# Patient Record
Sex: Male | Born: 1955
Health system: Southern US, Community
[De-identification: ages and names within clinical notes are randomized; demographics above are authoritative.]

## PROBLEM LIST (undated history)

## (undated) DIAGNOSIS — H532 Diplopia: Secondary | ICD-10-CM

## (undated) DIAGNOSIS — I10 Essential (primary) hypertension: Secondary | ICD-10-CM

## (undated) DIAGNOSIS — H02409 Unspecified ptosis of unspecified eyelid: Secondary | ICD-10-CM

## (undated) DIAGNOSIS — G7000 Myasthenia gravis without (acute) exacerbation: Secondary | ICD-10-CM

## (undated) HISTORY — DX: Myasthenia gravis without (acute) exacerbation: G70.00

## (undated) HISTORY — DX: Essential (primary) hypertension: I10

## (undated) HISTORY — PX: NO PAST SURGERIES: SHX2092

## (undated) HISTORY — DX: Diplopia: H53.2

## (undated) HISTORY — DX: Unspecified ptosis of unspecified eyelid: H02.409

---

## 2011-07-18 ENCOUNTER — Other Ambulatory Visit: Payer: Self-pay | Admitting: Neurology

## 2011-07-18 DIAGNOSIS — H532 Diplopia: Secondary | ICD-10-CM

## 2011-07-18 DIAGNOSIS — G7000 Myasthenia gravis without (acute) exacerbation: Secondary | ICD-10-CM

## 2011-07-18 DIAGNOSIS — H02409 Unspecified ptosis of unspecified eyelid: Secondary | ICD-10-CM

## 2011-07-30 ENCOUNTER — Ambulatory Visit
Admission: RE | Admit: 2011-07-30 | Discharge: 2011-07-30 | Disposition: A | Discharge status: Home / Self Care | Payer: BC Managed Care – PPO | Source: Ambulatory Visit | Attending: Neurology | Admitting: Neurology

## 2011-07-30 DIAGNOSIS — H02409 Unspecified ptosis of unspecified eyelid: Secondary | ICD-10-CM

## 2011-07-30 DIAGNOSIS — G7000 Myasthenia gravis without (acute) exacerbation: Secondary | ICD-10-CM

## 2011-07-30 DIAGNOSIS — H532 Diplopia: Secondary | ICD-10-CM

## 2012-10-01 DIAGNOSIS — M7582 Other shoulder lesions, left shoulder: Secondary | ICD-10-CM | POA: Insufficient documentation

## 2012-10-01 DIAGNOSIS — M7542 Impingement syndrome of left shoulder: Secondary | ICD-10-CM | POA: Insufficient documentation

## 2013-05-25 DIAGNOSIS — M7541 Impingement syndrome of right shoulder: Secondary | ICD-10-CM | POA: Insufficient documentation

## 2014-06-13 ENCOUNTER — Encounter: Payer: Self-pay | Admitting: Neurology

## 2014-06-13 ENCOUNTER — Ambulatory Visit (INDEPENDENT_AMBULATORY_CARE_PROVIDER_SITE_OTHER): Payer: BLUE CROSS/BLUE SHIELD | Admitting: Neurology

## 2014-06-13 VITALS — BP 155/96 | HR 104 | Ht 71.0 in | Wt 149.0 lb

## 2014-06-13 DIAGNOSIS — G7000 Myasthenia gravis without (acute) exacerbation: Secondary | ICD-10-CM | POA: Insufficient documentation

## 2014-06-13 DIAGNOSIS — H532 Diplopia: Secondary | ICD-10-CM | POA: Insufficient documentation

## 2014-06-13 DIAGNOSIS — H02409 Unspecified ptosis of unspecified eyelid: Secondary | ICD-10-CM | POA: Insufficient documentation

## 2014-06-13 MED ORDER — PYRIDOSTIGMINE BROMIDE 60 MG PO TABS: 60.0000 mg | ORAL_TABLET | Freq: Two times a day (BID) | ORAL | Status: DC

## 2014-06-13 NOTE — Progress Notes (Signed)
PATIENT: Shawn Carter DOB: 20-Jul-1955  HISTORICAL  Shawn Carter is a 59 year old right-handed Caucasian male, follow-up for seronegative ocular myasthenia gravis.  He was diagnosed with myasthenia gravis in 2013, presented with left fatigable ptosis, intermittent diplopia in 2012, but never had bulbar, or limb muscle involvement, Previous MRI of the brain was normal, normal or negative acetylcholine receptor antibody, CPK, B12, C-reactive protein, RPR, ANA, CMP, CBC.  MUSK antibody was not performed, because of high co-pay Single-fiber EMG [facility] by Dr. Jeremy JohannJanice Massey showed increased jitter at left orbicularis oculi, consistent with neuromuscular junctional disorder  CT chest showed no evidence of thymoma  He was given a trial of Mestinon treatment, complains of urinary urgency, but no significant GI side effects, he has not taken it for a while, never was treated with immunosuppressive treatment.  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 lisinopril (PRINIVIL,ZESTRIL) 10 MG tablet Take 10 mg by mouth daily.     No current facility-administered medications for this visit.    PAST MEDICAL HISTORY: Past Medical History  Diagnosis Date  . Myasthenia gravis   . Diplopia   . Ptosis   . Hypertension     PAST SURGICAL HISTORY: Past Surgical History  Procedure Laterality Date  . No past surgeries      FAMILY HISTORY: Family History  Problem Relation Age of Onset  . Heart Problems Mother   . Cancer Mother     SOCIAL HISTORY:  History   Social History  . Marital Status: Married    Spouse Name: N/A    Number of Children: 2  . Years of Education:  college   Occupational History  . Field Tour managerTechnician     [employer]    Social History Main Topics  . Smoking status: Current Every Day Smoker -- 0.50 packs/day    Types: Cigarettes  . Smokeless tobacco: Never Used  . Alcohol Use: No  . Drug Use: No  . Sexual Activity: Not on file   Other Topics Concern  . Not on file   Social History Narrative   Patient lives at home with wife and works full time for [employer]   Education college.   Right handed.   5 cups caffeine/day.   One biological son, one step-son        PHYSICAL EXAM   Filed Vitals:   06/13/14 1129  BP: 155/96  Pulse: 104  Height: 5\' 11"  (1.803 m)  Weight: 149 lb (67.586 kg)    Not recorded      Body mass index is 20.79 kg/(m^2).   Generalized: In no acute distress  Neck: Supple, no carotid bruits   Cardiac: Regular rate rhythm  Pulmonary: Clear to auscultation bilaterally  Musculoskeletal: No deformity  Neurological examination  Mentation: Alert oriented to time, place, history taking, and causual conversation  Cranial nerve II-XII: Pupils were equal round reactive to light. Extraocular movements were full.  Visual field were full on confrontational test. Bilateral fundi were sharp.  Facial sensation was normal.he has fatigable left ptosis, mild eye-closure weakness. Red lens testing showed mild bilateral lateral rectus muscle weakness.  Hearing was intact to finger rubbing bilaterally. Uvula tongue midline.  Head turning and shoulder shrug and were normal and symmetric.Tongue protrusion into cheek strength was normal.  Motor: Normal tone, bulk and strength.  Sensory: Intact to fine touch, pinprick, preserved vibratory sensation, and proprioception at toes.  Coordination: Normal finger to nose, heel-to-shin bilaterally there was no truncal ataxia  Gait: Rising up from seated position without assistance, normal stance, without trunk ataxia, moderate stride, good arm swing, smooth turning, able to  perform tiptoe, and heel walking without difficulty.   Romberg signs: Negative  Deep tendon reflexes: Brachioradialis 2/2, biceps 2/2, triceps 2/2, patellar 2/2, Achilles 2/2, plantar responses were flexor bilaterally.   DIAGNOSTIC DATA (LABS, IMAGING, TESTING) - I reviewed patient records, labs, notes, testing and imaging myself where available.  ASSESSMENT AND PLAN  Shawn Carter is a 59 y.o. male  With seronegative myasthenia gravis, overall doing well, continue have fatigable left ptosis, mild bilateral lateral rectus muscle weakness,  1, Mestinon as needed 2, no immunosuppressant treatment at this point call clinic for worsening symptoms 3,  return to clinic in one year   No orders of the defined types were placed in this encounter.    Return in about 1 year (around 06/14/2015).  Levert Feinstein, M.D. Ph.D.  [facility] [address] [place], [place] [address] [phone]

## 2015-03-21 ENCOUNTER — Other Ambulatory Visit: Payer: Self-pay | Admitting: Neurology

## 2015-06-13 ENCOUNTER — Other Ambulatory Visit: Payer: Self-pay | Admitting: Neurology

## 2015-06-19 ENCOUNTER — Encounter: Payer: Self-pay | Admitting: Neurology

## 2015-06-19 ENCOUNTER — Ambulatory Visit (INDEPENDENT_AMBULATORY_CARE_PROVIDER_SITE_OTHER): Payer: BLUE CROSS/BLUE SHIELD | Admitting: Neurology

## 2015-06-19 VITALS — BP 141/88 | HR 68 | Ht 71.0 in | Wt 180.0 lb

## 2015-06-19 DIAGNOSIS — H02409 Unspecified ptosis of unspecified eyelid: Secondary | ICD-10-CM | POA: Diagnosis not present

## 2015-06-19 DIAGNOSIS — H532 Diplopia: Secondary | ICD-10-CM | POA: Diagnosis not present

## 2015-06-19 DIAGNOSIS — G7000 Myasthenia gravis without (acute) exacerbation: Secondary | ICD-10-CM

## 2015-06-19 MED ORDER — PYRIDOSTIGMINE BROMIDE 60 MG PO TABS: 60.0000 mg | ORAL_TABLET | Freq: Two times a day (BID) | ORAL | Status: DC

## 2015-06-19 NOTE — Progress Notes (Signed)
Chief Complaint  Patient presents with  . Myasthenia Gravis    He is here for his yearly follow up.  He continues to have double vision but feels it is no worse than last year.      PATIENT: Shawn Carter DOB: 10/15/1955  HISTORICAL  Shawn Carter is a 60 year old right-handed Caucasian male, follow-up for seronegative ocular myasthenia gravis.  He was diagnosed with myasthenia gravis in 2013, presented with left fatigable ptosis, intermittent diplopia in 2012, but never had bulbar, or limb muscle involvement, Previous MRI of the brain was normal, normal or negative acetylcholine receptor antibody, CPK, B12, C-reactive protein, RPR, ANA, CMP, CBC.  MUSK antibody was not performed, because of high co-pay Single-fiber EMG at [facility] by Dr. Jeremy Johann showed increased jitter at left orbicularis oculi, consistent with neuromuscular junctional disorder  CT chest showed no evidence of thymoma  He was given a trial of Mestinon treatment, complains of urinary urgency, but no significant GI side effects, he has not taken it for a while, never was treated with immunosuppressive treatment.  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 lisinopril (PRINIVIL,ZESTRIL) 10 MG tablet Take 10 mg by mouth daily.    Marland Kitchen pyridostigmine (MESTINON) 60 MG tablet TAKE 1 TABLET BY MOUTH TWICE DAILY 60 tablet 0   No current facility-administered medications for this visit.    PAST MEDICAL HISTORY: Past  Medical History  Diagnosis Date  . Myasthenia gravis (HCC)   . Diplopia   . Ptosis   . Hypertension     PAST SURGICAL HISTORY: Past Surgical History  Procedure Laterality Date  . No past surgeries      FAMILY HISTORY: Family History  Problem Relation Age of Onset  . Heart Problems Mother   . Cancer Mother     SOCIAL HISTORY:  Social History   Social History  . Marital Status: Married    Spouse Name: N/A  . Number of Children: 2  . Years of Education: college   Occupational History  . Field Tour manager - [employer]    Social History Main Topics  . Smoking status: Current Every Day Smoker -- 0.50 packs/day    Types: Cigarettes  . Smokeless tobacco: Never Used  . Alcohol Use: No  . Drug Use: No  . Sexual Activity: Not on file   Other Topics Concern  . Not on file   Social History Narrative   Patient lives at home with wife and works full time for [employer]   Education college.   Right handed.   5 cups caffeine/day.   One biological son, one step-son        PHYSICAL EXAM   Filed Vitals:   06/19/15 0914  BP: 141/88  Pulse: 68  Height:  (1.803 m)  Weight: 180 lb (81.647 kg)    Not recorded      Body mass index is 25.12 kg/(m^2).   Generalized: In no acute distress  Neck: Supple, no carotid bruits  Cardiac: Regular rate rhythm  Pulmonary: Clear to auscultation bilaterally  Musculoskeletal: No deformity  Neurological examination  Mentation: Alert oriented to time, place, history taking, and causual conversation  Cranial nerve II-XII: Pupils were equal round reactive to light. Extraocular movements were full.  Visual field were full on confrontational test. Bilateral fundi were sharp.  Facial sensation was normal.he has fatigable left ptosis, there was no significant eye-closure weakness. Red lens testing showed mild bilateral lateral rectus muscle weakness.  Hearing was intact to finger rubbing bilaterally. Uvula tongue midline.   Head turning and shoulder shrug and were normal and symmetric.Tongue protrusion into cheek strength was normal.  Motor: Normal tone, bulk and strength.  Sensory: Intact to fine touch, pinprick, preserved vibratory sensation, and proprioception at toes.  Coordination: Normal finger to nose, heel-to-shin bilaterally there was no truncal ataxia  Gait: Rising up from seated position without assistance, normal stance, without trunk ataxia, moderate stride, good arm swing, smooth turning, able to perform tiptoe, and heel walking without difficulty.   Romberg signs: Negative  Deep tendon reflexes: Brachioradialis 2/2, biceps 2/2, triceps 2/2, patellar 2/2, Achilles 2/2, plantar responses were flexor bilaterally.   DIAGNOSTIC DATA (LABS, IMAGING, TESTING) - I reviewed patient records, labs, notes, testing and imaging myself where available.  ASSESSMENT AND PLAN  Shawn Carter is a 60 y.o. male  With seronegative myasthenia gravis, overall doing well, continue have fatigable left ptosis, mild bilateral lateral rectus muscle weakness,  1, Mestinon as needed 2, no immunosuppressant treatment at this point call clinic for worsening symptoms 3,  return to clinic in one year    Levert Feinstein, M.D. Ph.D.  [facility] [address] [place], [place] [address] [phone]

## 2016-06-17 ENCOUNTER — Telehealth: Payer: Self-pay | Admitting: *Deleted

## 2016-06-17 NOTE — Telephone Encounter (Signed)
lft message for pt to call and reschedule

## 2016-06-18 ENCOUNTER — Ambulatory Visit: Payer: BLUE CROSS/BLUE SHIELD | Admitting: Adult Health

## 2016-06-20 ENCOUNTER — Other Ambulatory Visit: Payer: Self-pay | Admitting: Neurology

## 2016-07-21 DIAGNOSIS — I10 Essential (primary) hypertension: Secondary | ICD-10-CM | POA: Diagnosis not present

## 2016-07-21 DIAGNOSIS — Z0000 Encounter for general adult medical examination without abnormal findings: Secondary | ICD-10-CM | POA: Diagnosis not present

## 2016-07-21 DIAGNOSIS — Z125 Encounter for screening for malignant neoplasm of prostate: Secondary | ICD-10-CM | POA: Diagnosis not present

## 2016-07-31 ENCOUNTER — Encounter: Payer: Self-pay | Admitting: Adult Health

## 2016-07-31 ENCOUNTER — Encounter (INDEPENDENT_AMBULATORY_CARE_PROVIDER_SITE_OTHER): Payer: Self-pay

## 2016-07-31 ENCOUNTER — Ambulatory Visit (INDEPENDENT_AMBULATORY_CARE_PROVIDER_SITE_OTHER): Payer: BLUE CROSS/BLUE SHIELD | Admitting: Adult Health

## 2016-07-31 VITALS — BP 135/78 | HR 81 | Resp 20 | Ht 71.0 in | Wt 183.0 lb

## 2016-07-31 DIAGNOSIS — H02402 Unspecified ptosis of left eyelid: Secondary | ICD-10-CM

## 2016-07-31 DIAGNOSIS — G7000 Myasthenia gravis without (acute) exacerbation: Secondary | ICD-10-CM

## 2016-07-31 NOTE — Patient Instructions (Signed)
Continue mestinon If your symptoms worsen or you develop new symptoms please let us know.

## 2016-07-31 NOTE — Progress Notes (Signed)
PATIENT: Shawn Carter DOB: [birth year]  REASON FOR VISIT: follow up HISTORY FROM: patient  HISTORY OF PRESENT ILLNESS: HISTORY per Dr. Terrace Arabiayan: Shawn Carter is a 61 year old right-handed Caucasian male, follow-up for seronegative ocular myasthenia gravis.  He was diagnosed with myasthenia gravis in 2013, presented with left fatigable ptosis, intermittent diplopia in 2012, but never had bulbar, or limb muscle involvement, Previous MRI of the brain was normal, normal or negative acetylcholine receptor antibody, CPK, B12, C-reactive protein, RPR, ANA, CMP, CBC.  MUSK antibody was not performed, because of high co-pay [facility]fiber [facility]G at [facility] by Dr. Jeremy JohannJanice Massey showed increased jitter at left orbicularis oculi, consistent with neuromuscular junctional disorder  CT chest showed no evidence of thymoma  He was given a trial of Mestinon treatment, complains of urinary urgency, but no significant GI side effects, he has not taken it for a while, never was treated with immunosuppressive treatment.  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 He returns today for follow-up. Overall he feels that he is remaining stable. He continues to have mild left ptosis. He states that his wife notices that it does worsen when he is fatigued. He states that he does experience diplopia. He states that he mainly notices this with distance. Denies any trouble swallowing. Denies any trouble speaking or chewing. Denies weakness in upper or lower extremity. He reports he does have bursitis in both shoulders. Denies any trouble breathing.  He returns today for an evaluation.   REVIEW OF SYSTEMS: Out of a complete 14 system review of symptoms, the patient complains only of the following symptoms, and all other reviewed systems are negative.  Double vision  ALLERGIES: No Known Allergies  HOME MEDICATIONS: Outpatient Medications Prior to Visit  Medication Sig Dispense Refill  . pyridostigmine (MESTINON) 60 MG tablet TAKE 1 TABLET (60 MG TOTAL) BY MOUTH 2 (TWO) TIMES DAILY. 60 tablet 10  . lisinopril (PRINIVIL,ZESTRIL) 10 MG tablet Take 10 mg by mouth daily.     No facility-administered medications prior to visit.     PAST MEDICAL HISTORY: Past Medical History:  Diagnosis Date  . Diplopia   . Hypertension   . Myasthenia gravis (HCC)   . Ptosis     PAST SURGICAL HISTORY: Past Surgical History:  Procedure Laterality Date  . NO PAST SURGERIES      FAMILY HISTORY: Family History  Problem Relation Age of Onset  . Heart Problems Mother   . Cancer Mother     SOCIAL HISTORY: Social History   Social History  . Marital status: Married    Spouse name: N/A  . Number of children: 2  . Years of education: college   Occupational History  . Field Tour managerTechnician     [employer]    Social History Main Topics  . Smoking status: Current Every Day Smoker    Packs/day: 0.50    Types: Cigarettes  . Smokeless tobacco: Never Used  . Alcohol use No  . Drug use: No  . Sexual activity: Not on file   Other Topics Concern  . Not on file   Social History Narrative   Patient  lives at home with wife and works full time for [employer]   Education college.   Right handed.   5 cups caffeine/day.   One biological son, one step-son         PHYSICAL EXAM  Vitals:   07/31/16 0724  BP: 135/78  Pulse: 81  Resp: 20  Weight: 183 lb (83 kg)  Height: 5\' 11"  (1.803 m)   Body mass index is 25.52 kg/m.  Generalized: Well developed, in no acute distress   Neurological examination  Mentation: Alert oriented to time,  place, history taking. Follows all commands speech and language fluent Cranial nerve II-XII: Pupils were equal round reactive to light. Extraocular movements were full, visual field were full on confrontational test. Facial sensation and strength were normal. Uvula tongue midline. Head turning and shoulder shrug  were normal and symmetric.No puff cheek weakness. Mild ptosis on left. With superior gaze for 1 minute no worsening of ptosis noted.  Motor: The motor testing reveals 5 over 5 strength of all 4 extremities. Good symmetric motor tone is noted throughout.  with arms abducted for 1 minute no weakness noted.  Sensory: Sensory testing is intact to soft touch on all 4 extremities. No evidence of extinction is noted.  Coordination: Cerebellar testing reveals good finger-nose-finger and heel-to-shin bilaterally.  Gait and station: Gait is normal. Tandem gait is normal. Romberg is negative. No drift is seen.  Reflexes: Deep tendon reflexes are symmetric and normal bilaterally.   DIAGNOSTIC DATA (LABS, IMAGING, TESTING) - I reviewed patient records, labs, notes, testing and imaging myself where available.      ASSESSMENT AND PLAN 61 y.o. year old male  has a past medical history of Diplopia; Hypertension; Myasthenia gravis (HCC); and Ptosis. here with:  1. Myasthenia gravis 2. Ptosis  Overall the patient has remained stable. He will continue on Mestinon 60 mg daily. Advised that if his symptoms worsen or he develops new symptoms he should let us know. We also reviewed symptoms of myasthenia crisis and he was advised to go to the emergency room should this happen. He will follow-up in one year or sooner if needed.    Butch Penny, MSN, NP-C 07/31/2016, 7:47 AM [facility] [address] [place], [place] [address] [phone]

## 2016-07-31 NOTE — Progress Notes (Signed)
I have reviewed and agreed above plan. 

## 2016-08-16 ENCOUNTER — Encounter (HOSPITAL_COMMUNITY): Payer: Self-pay | Admitting: Family Medicine

## 2016-08-16 ENCOUNTER — Ambulatory Visit (HOSPITAL_COMMUNITY)
Admission: EM | Admit: 2016-08-16 | Discharge: 2016-08-16 | Disposition: A | Discharge status: Home / Self Care | Payer: BLUE CROSS/BLUE SHIELD | Attending: Internal Medicine | Admitting: Internal Medicine

## 2016-08-16 DIAGNOSIS — R220 Localized swelling, mass and lump, head: Secondary | ICD-10-CM | POA: Diagnosis not present

## 2016-08-16 MED ORDER — CEPHALEXIN 500 MG PO CAPS: 500.0000 mg | ORAL_CAPSULE | Freq: Three times a day (TID) | ORAL | 0 refills | Status: AC

## 2016-08-16 NOTE — ED Triage Notes (Signed)
Pt here for pain and swelling to right upper facial ara since yesterday. sts possibly bad tooth.

## 2016-08-16 NOTE — ED Provider Notes (Signed)
CSN: [id number]     Arrival date & time 08/16/16  1630 History   First MD Initiated Contact with Patient 08/16/16 1748     Chief Complaint  Patient presents with  . Facial Swelling   (Consider location/radiation/quality/duration/timing/severity/associated sxs/prior Treatment) Patient came in today for right facial swelling onset today but he started experiencing pain and tenderness over his right face yesterday. Wife reports positive history of sinus infection but is also worry that it may be related to dental problem. There has been no fever at home. Patient endorses running nose but no congestion, sneezing, coughing, or other cold symptoms.        Past Medical History:  Diagnosis Date  . Diplopia   . Hypertension   . Myasthenia gravis (HCC)   . Ptosis    Past Surgical History:  Procedure Laterality Date  . NO PAST SURGERIES     Family History  Problem Relation Age of Onset  . Heart Problems Mother   . Cancer Mother    Social History  Substance Use Topics  . Smoking status: Current Every Day Smoker    Packs/day: 0.50    Types: Cigarettes  . Smokeless tobacco: Never Used  . Alcohol use No    Review of Systems  Constitutional:       As stated in the HPI    Allergies  Patient has no known allergies.  Home Medications   Prior to Admission medications   Medication Sig Start Date End Date Taking? Authorizing Provider  aspirin EC 81 MG tablet Take 81 mg by mouth daily.    Historical Provider, MD  cephALEXin (KEFLEX) 500 MG capsule Take 1 capsule (500 mg total) by mouth 3 (three) times daily. 08/16/16 08/23/16  Lucia Estelle, NP  lisinopril (PRINIVIL,ZESTRIL) 10 MG tablet Take 10 mg by mouth daily. 05/29/14   Historical Provider, MD  pyridostigmine (MESTINON) 60 MG tablet TAKE 1 TABLET (60 MG TOTAL) BY MOUTH 2 (TWO) TIMES DAILY. 06/20/16   Levert Feinstein, MD   Meds Ordered and Administered this Visit  Medications - No data to display  BP (!) 142/90   Pulse 83   Temp 98.9 F  (37.2 C)   Resp 16   SpO2 99%  No data found.   Physical Exam  Constitutional: He is oriented to person, place, and time. He appears well-developed and well-nourished.  HENT:  Head: Normocephalic and atraumatic.    Right Ear: External ear normal.  Left Ear: External ear normal.  Nose: Nose normal.  Mouth/Throat: Oropharynx is clear and moist. No oropharyngeal exudate.  TM pearly gray with no erythema.   Right lower face is noticeably swollen and tender to palpate, slightly warm to palapte  No dental pain or pain on gumline. No dental problem noted.  Has fairly good dental hygiene.   Eyes: Conjunctivae and EOM are normal. Pupils are equal, round, and reactive to light.  Neck: Normal range of motion. Neck supple.  Cardiovascular: Normal rate, regular rhythm and normal heart sounds.   Pulmonary/Chest: Effort normal and breath sounds normal. No respiratory distress. He has no wheezes.  Abdominal: Soft. Bowel sounds are normal. He exhibits no distension. There is no tenderness.  Neurological: He is alert and oriented to person, place, and time.  Skin: Skin is warm and dry.  Nursing note and vitals reviewed.   Urgent Care Course     Procedures (including critical care time)  Labs Review Labs Reviewed - No data to display  Imaging Review No results  found.  MDM   1. Facial swelling    1) No clear etiology but Parotitis is in differential.  2) Will treat empirically for keflex 500 mg TID x 7 days 3) Informed to f/u with PCP for no improvement 4) Return precaution discussed.    Lucia Estelle, NP 08/16/16 [phone]

## 2016-08-21 DIAGNOSIS — R220 Localized swelling, mass and lump, head: Secondary | ICD-10-CM | POA: Diagnosis not present

## 2017-02-18 DIAGNOSIS — E785 Hyperlipidemia, unspecified: Secondary | ICD-10-CM | POA: Diagnosis not present

## 2017-02-18 DIAGNOSIS — I10 Essential (primary) hypertension: Secondary | ICD-10-CM | POA: Diagnosis not present

## 2017-02-20 DIAGNOSIS — Z23 Encounter for immunization: Secondary | ICD-10-CM | POA: Diagnosis not present

## 2017-02-20 DIAGNOSIS — I10 Essential (primary) hypertension: Secondary | ICD-10-CM | POA: Diagnosis not present

## 2017-02-20 DIAGNOSIS — F17200 Nicotine dependence, unspecified, uncomplicated: Secondary | ICD-10-CM | POA: Diagnosis not present

## 2017-02-20 DIAGNOSIS — E785 Hyperlipidemia, unspecified: Secondary | ICD-10-CM | POA: Diagnosis not present

## 2017-06-21 DIAGNOSIS — J101 Influenza due to other identified influenza virus with other respiratory manifestations: Secondary | ICD-10-CM | POA: Diagnosis not present

## 2017-06-21 DIAGNOSIS — B349 Viral infection, unspecified: Secondary | ICD-10-CM | POA: Diagnosis not present

## 2017-07-06 ENCOUNTER — Other Ambulatory Visit: Payer: Self-pay | Admitting: Neurology

## 2017-08-04 ENCOUNTER — Encounter: Payer: Self-pay | Admitting: Neurology

## 2017-08-04 ENCOUNTER — Encounter (INDEPENDENT_AMBULATORY_CARE_PROVIDER_SITE_OTHER): Payer: Self-pay

## 2017-08-04 ENCOUNTER — Ambulatory Visit: Payer: BLUE CROSS/BLUE SHIELD | Admitting: Neurology

## 2017-08-04 VITALS — BP 147/93 | HR 83 | Ht 71.0 in | Wt 180.0 lb

## 2017-08-04 DIAGNOSIS — G7000 Myasthenia gravis without (acute) exacerbation: Secondary | ICD-10-CM

## 2017-08-04 DIAGNOSIS — Z206 Contact with and (suspected) exposure to human immunodeficiency virus [HIV]: Secondary | ICD-10-CM | POA: Diagnosis not present

## 2017-08-04 DIAGNOSIS — Z5181 Encounter for therapeutic drug level monitoring: Secondary | ICD-10-CM | POA: Diagnosis not present

## 2017-08-04 DIAGNOSIS — Z114 Encounter for screening for human immunodeficiency virus [HIV]: Secondary | ICD-10-CM | POA: Diagnosis not present

## 2017-08-04 DIAGNOSIS — Z113 Encounter for screening for infections with a predominantly sexual mode of transmission: Secondary | ICD-10-CM | POA: Diagnosis not present

## 2017-08-04 MED ORDER — PYRIDOSTIGMINE BROMIDE 60 MG PO TABS: 60.0000 mg | ORAL_TABLET | Freq: Two times a day (BID) | ORAL | 11 refills | Status: AC | PRN

## 2017-08-04 NOTE — Progress Notes (Signed)
PATIENT: Shawn AlarJoseph Quintela DOB: February 17, 1956  REASON FOR VISIT: follow up HISTORY FROM: patient  HISTORY OF PRESENT ILLNESS: HISTORY per Dr. Terrace Arabiayan: Shawn Carter is a 62 year old right-handed Caucasian male, follow-up for seronegative ocular myasthenia gravis.  He was diagnosed with myasthenia gravis in 2013, presented with left fatigable ptosis, intermittent diplopia in 2012, but never had bulbar, or limb muscle involvement, Previous MRI of the brain was normal, MRA of brain was normal, normal or negative acetylcholine receptor antibody, CPK, B12, C-reactive protein, RPR, ANA, CMP, CBC.  MUSK antibody was not performed, because of high co-pay Single-fiber EMG at [facility] by Dr. Jeremy JohannJanice Massey showed increased jitter at left orbicularis oculi, consistent with neuromuscular junctional disorder  CT chest showed no evidence of thymoma  He was given a trial of Mestinon treatment, complains of urinary urgency, but no significant GI side effects, he has not taken it for a while, never was treated with immunosuppressive treatment.  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, without difficulty   UPDATE Feb 7th 2017: He is doing well, continued to has mild left ptosis, occasionally double vision especially looking far distance, he denies dysarthria, no dysphagia, no limb muscle weakness  UPDATE August 04 2017: He is overall doing very well, his job without any difficulty, carry 40-50 pounds without problem, still has persistent left ptosis, intermittent worsening with fatigue, occasionally double vision with prolonged reading, looking for distance, he is taking Mestinon 60 mg daily, but did not see any significant benefit, he denies bulbar and limb muscle weakness,   REVIEW OF SYSTEMS: Out of a complete 14 system review of symptoms, the patient complains only of the following symptoms, and all other reviewed systems are  negative.  Double vision  ALLERGIES: No Known Allergies  HOME MEDICATIONS: Outpatient Medications Prior to Visit  Medication Sig Dispense Refill  . aspirin EC 81 MG tablet Take 81 mg by mouth daily.    Marland Kitchen. lisinopril (PRINIVIL,ZESTRIL) 10 MG tablet Take 10 mg by mouth daily.    Marland Kitchen. pyridostigmine (MESTINON) 60 MG tablet TAKE 1 TABLET (60 MG TOTAL) BY MOUTH 2 (TWO) TIMES DAILY. 60 tablet 9   No facility-administered medications prior to visit.     PAST MEDICAL HISTORY: Past Medical History:  Diagnosis Date  . Diplopia   . Hypertension   . Myasthenia gravis (HCC)   . Ptosis     PAST SURGICAL HISTORY: Past Surgical History:  Procedure Laterality Date  . NO PAST SURGERIES      FAMILY HISTORY: Family History  Problem Relation Age of Onset  . Heart Problems Mother   . Cancer Mother     SOCIAL HISTORY: Social History   Socioeconomic History  . Marital status: Married    Spouse name: Not on file  . Number of children: 2  . Years of education: college  . Highest education level: Not on file  Occupational History  . Occupation: Engineer, productionield Technician    Comment: [employer]   Social Needs  . Financial resource strain: Not on file  . Food insecurity:    Worry: Not on file    Inability: Not on file  . Transportation needs:    Medical: Not on file    Non-medical: Not on file  Tobacco Use  . Smoking status: Current Every Day Smoker    Packs/day: 0.50    Types: Cigarettes  . Smokeless tobacco: Never Used  Substance and  Sexual Activity  . Alcohol use: No    Alcohol/week: 0.0 oz  . Drug use: No  . Sexual activity: Not on file  Lifestyle  . Physical activity:    Days per week: Not on file    Minutes per session: Not on file  . Stress: Not on file  Relationships  . Social connections:    Talks on phone: Not on file    Gets together: Not on file    Attends religious service: Not on file    Active member of club or organization: Not on file    Attends meetings of clubs  or organizations: Not on file    Relationship status: Not on file  . Intimate partner violence:    Fear of current or ex partner: Not on file    Emotionally abused: Not on file    Physically abused: Not on file    Forced sexual activity: Not on file  Other Topics Concern  . Not on file  Social History Narrative   Patient lives at home with wife and works full time for [employer]   Education college.   Right handed.   5 cups caffeine/day.   One biological son, one step-son      PHYSICAL EXAM  Vitals:   08/04/17 0715  BP: (!) 147/93  Pulse: 83  Weight: 180 lb (81.6 kg)  Height: 5\' 11"  (1.803 m)   Body mass index is 25.1 kg/m.  Generalized: Well developed, in no acute distress   Neurological examination  Mentation: Alert oriented to time, place, history taking. Follows all commands speech and language fluent Cranial nerve II-XII: Pupils were equal round reactive to light. Extraocular movements were full, visual field were full on confrontational test. Facial sensation and strength were normal. Uvula tongue midline. Head turning and shoulder shrug  were normal and symmetric.No puff cheek weakness. Mild ptosis on left. With superior gaze for 1 minute no worsening of ptosis noted.  Motor: The motor testing reveals 5 over 5 strength of all 4 extremities. Good symmetric motor tone is noted throughout.  with arms abducted for 1 minute no weakness noted.  Sensory: Sensory testing is intact to soft touch on all 4 extremities. No evidence of extinction is noted.  Coordination: Cerebellar testing reveals good finger-nose-finger and heel-to-shin bilaterally.  Gait and station: Gait is normal. Tandem gait is normal. Romberg is negative. No drift is seen.  Reflexes: Deep tendon reflexes are symmetric and normal bilaterally.   DIAGNOSTIC DATA (LABS, IMAGING, TESTING) - I reviewed patient records, labs, notes, testing and imaging myself where available.      ASSESSMENT AND PLAN 62  y.o. year old male   Seronegative ocular myasthenia gravis  Symptoms has been stable over the years  Will hold off further evaluation at this point  Mestinon 60 mg as needed  Levert Feinstein, M.D. Ph.D.  [facility] [address] [place], [place] [address] Phone: [phone] Fax:      [phone]

## 2017-08-05 DIAGNOSIS — G7000 Myasthenia gravis without (acute) exacerbation: Secondary | ICD-10-CM | POA: Diagnosis not present

## 2017-08-05 DIAGNOSIS — H02402 Unspecified ptosis of left eyelid: Secondary | ICD-10-CM | POA: Diagnosis not present

## 2017-08-05 DIAGNOSIS — H532 Diplopia: Secondary | ICD-10-CM | POA: Diagnosis not present

## 2017-08-05 DIAGNOSIS — H2513 Age-related nuclear cataract, bilateral: Secondary | ICD-10-CM | POA: Diagnosis not present

## 2017-08-21 DIAGNOSIS — Z125 Encounter for screening for malignant neoplasm of prostate: Secondary | ICD-10-CM | POA: Diagnosis not present

## 2017-08-21 DIAGNOSIS — E785 Hyperlipidemia, unspecified: Secondary | ICD-10-CM | POA: Diagnosis not present

## 2017-08-21 DIAGNOSIS — Z0000 Encounter for general adult medical examination without abnormal findings: Secondary | ICD-10-CM | POA: Diagnosis not present

## 2017-08-21 DIAGNOSIS — I10 Essential (primary) hypertension: Secondary | ICD-10-CM | POA: Diagnosis not present

## 2017-12-15 DIAGNOSIS — J0100 Acute maxillary sinusitis, unspecified: Secondary | ICD-10-CM | POA: Diagnosis not present

## 2017-12-15 DIAGNOSIS — R509 Fever, unspecified: Secondary | ICD-10-CM | POA: Diagnosis not present

## 2018-06-09 DIAGNOSIS — Z6822 Body mass index (BMI) 22.0-22.9, adult: Secondary | ICD-10-CM | POA: Diagnosis not present

## 2018-06-09 DIAGNOSIS — Z0000 Encounter for general adult medical examination without abnormal findings: Secondary | ICD-10-CM | POA: Diagnosis not present

## 2018-06-09 DIAGNOSIS — Z13220 Encounter for screening for lipoid disorders: Secondary | ICD-10-CM | POA: Diagnosis not present

## 2018-07-14 DIAGNOSIS — F321 Major depressive disorder, single episode, moderate: Secondary | ICD-10-CM | POA: Diagnosis not present

## 2018-07-28 DIAGNOSIS — F900 Attention-deficit hyperactivity disorder, predominantly inattentive type: Secondary | ICD-10-CM | POA: Diagnosis not present

## 2018-07-28 DIAGNOSIS — F419 Anxiety disorder, unspecified: Secondary | ICD-10-CM | POA: Diagnosis not present

## 2018-08-04 DIAGNOSIS — F900 Attention-deficit hyperactivity disorder, predominantly inattentive type: Secondary | ICD-10-CM | POA: Diagnosis not present

## 2018-08-04 DIAGNOSIS — F419 Anxiety disorder, unspecified: Secondary | ICD-10-CM | POA: Diagnosis not present

## 2018-08-25 DIAGNOSIS — F900 Attention-deficit hyperactivity disorder, predominantly inattentive type: Secondary | ICD-10-CM | POA: Diagnosis not present

## 2018-08-25 DIAGNOSIS — F419 Anxiety disorder, unspecified: Secondary | ICD-10-CM | POA: Diagnosis not present

## 2018-08-31 DIAGNOSIS — E785 Hyperlipidemia, unspecified: Secondary | ICD-10-CM | POA: Diagnosis not present

## 2018-08-31 DIAGNOSIS — G7000 Myasthenia gravis without (acute) exacerbation: Secondary | ICD-10-CM | POA: Diagnosis not present

## 2018-08-31 DIAGNOSIS — F17200 Nicotine dependence, unspecified, uncomplicated: Secondary | ICD-10-CM | POA: Diagnosis not present

## 2018-08-31 DIAGNOSIS — I10 Essential (primary) hypertension: Secondary | ICD-10-CM | POA: Diagnosis not present

## 2019-01-24 DIAGNOSIS — E785 Hyperlipidemia, unspecified: Secondary | ICD-10-CM | POA: Diagnosis not present

## 2019-01-24 DIAGNOSIS — Z125 Encounter for screening for malignant neoplasm of prostate: Secondary | ICD-10-CM | POA: Diagnosis not present

## 2019-01-24 DIAGNOSIS — I10 Essential (primary) hypertension: Secondary | ICD-10-CM | POA: Diagnosis not present

## 2019-01-24 DIAGNOSIS — Z23 Encounter for immunization: Secondary | ICD-10-CM | POA: Diagnosis not present

## 2019-01-24 DIAGNOSIS — Z0000 Encounter for general adult medical examination without abnormal findings: Secondary | ICD-10-CM | POA: Diagnosis not present

## 2019-02-09 DIAGNOSIS — F900 Attention-deficit hyperactivity disorder, predominantly inattentive type: Secondary | ICD-10-CM | POA: Diagnosis not present

## 2019-02-09 DIAGNOSIS — F419 Anxiety disorder, unspecified: Secondary | ICD-10-CM | POA: Diagnosis not present

## 2019-04-06 DIAGNOSIS — Z1159 Encounter for screening for other viral diseases: Secondary | ICD-10-CM | POA: Diagnosis not present

## 2019-04-11 DIAGNOSIS — D122 Benign neoplasm of ascending colon: Secondary | ICD-10-CM | POA: Diagnosis not present

## 2019-04-11 DIAGNOSIS — K5730 Diverticulosis of large intestine without perforation or abscess without bleeding: Secondary | ICD-10-CM | POA: Diagnosis not present

## 2019-04-11 DIAGNOSIS — K635 Polyp of colon: Secondary | ICD-10-CM | POA: Diagnosis not present

## 2019-04-11 DIAGNOSIS — K640 First degree hemorrhoids: Secondary | ICD-10-CM | POA: Diagnosis not present

## 2019-04-11 DIAGNOSIS — Z86010 Personal history of colonic polyps: Secondary | ICD-10-CM | POA: Diagnosis not present

## 2020-08-03 DIAGNOSIS — Z86010 Personal history of colonic polyps: Secondary | ICD-10-CM | POA: Diagnosis not present

## 2020-08-03 DIAGNOSIS — E785 Hyperlipidemia, unspecified: Secondary | ICD-10-CM | POA: Diagnosis not present

## 2020-08-03 DIAGNOSIS — I10 Essential (primary) hypertension: Secondary | ICD-10-CM | POA: Diagnosis not present

## 2020-08-07 ENCOUNTER — Ambulatory Visit (INDEPENDENT_AMBULATORY_CARE_PROVIDER_SITE_OTHER): Payer: BC Managed Care – PPO

## 2020-08-07 ENCOUNTER — Other Ambulatory Visit: Payer: Self-pay

## 2020-08-07 ENCOUNTER — Ambulatory Visit: Payer: BC Managed Care – PPO | Admitting: Podiatry

## 2020-08-07 DIAGNOSIS — M7741 Metatarsalgia, right foot: Secondary | ICD-10-CM | POA: Diagnosis not present

## 2020-08-07 DIAGNOSIS — G5760 Lesion of plantar nerve, unspecified lower limb: Secondary | ICD-10-CM

## 2020-08-07 DIAGNOSIS — G5781 Other specified mononeuropathies of right lower limb: Secondary | ICD-10-CM | POA: Diagnosis not present

## 2020-08-07 MED ORDER — BETAMETHASONE SOD PHOS & ACET 6 (3-3) MG/ML IJ SUSP
3.0000 mg | Freq: Once | INTRAMUSCULAR | Status: AC
  Administered 2020-08-07: 3 mg

## 2020-08-07 NOTE — Patient Instructions (Signed)

## 2020-08-07 NOTE — Progress Notes (Signed)
  Subjective:  Patient ID: Shawn Carter, male    DOB: 1955-08-08,  MRN: [id number]  Chief Complaint  Patient presents with  . Neuroma       (xray)(np) possible mortons neuroma    65 y.o. male presents with the above complaint. History confirmed with patient.   Objective:  Physical Exam: warm, good capillary refill, no trophic changes or ulcerative lesions, normal DP and PT pulses and normal sensory exam. Right Foot: tenderness between the 2nd and 3rd metatarsal head and Mulder's click 2nd interspac   No images are attached to the encounter.  Radiographs: X-ray of the right foot: no fracture, dislocation, swelling or degenerative changes noted and decreased 2nd IMA Assessment:   1. Interdigital neuroma of right foot   2. Metatarsalgia, right foot    Plan:  Patient was evaluated and treated and all questions answered.  Morton Neuroma -Educated on etiology -Educated on padding and proper shoegear -XR reviewed with patient -Injection delivered to the affected interspaces  Procedure: Neuroma Injection Location: Right 2nd interspace Skin Prep: Alcohol. Injectate: 0.5 cc 0.5% marcaine plain, 0.5 cc betamethasone acetate-betamethasone sodium phosphate Disposition: Patient tolerated procedure well. Injection site dressed with a band-aid.  Return in about 3 weeks (around 08/28/2020) for Neuroma.

## 2020-08-31 ENCOUNTER — Other Ambulatory Visit: Payer: Self-pay

## 2020-08-31 ENCOUNTER — Ambulatory Visit: Payer: BC Managed Care – PPO | Admitting: Podiatry

## 2020-08-31 DIAGNOSIS — G5781 Other specified mononeuropathies of right lower limb: Secondary | ICD-10-CM

## 2020-08-31 MED ORDER — BETAMETHASONE SOD PHOS & ACET 6 (3-3) MG/ML IJ SUSP
6.0000 mg | Freq: Once | INTRAMUSCULAR | Status: AC
  Administered 2020-08-31: 6 mg

## 2020-08-31 NOTE — Progress Notes (Signed)
  Subjective:  Patient ID: Shawn Carter, male    DOB: December 28, 1955,  MRN: [id number]  Chief Complaint  Patient presents with  . Neuroma    4 week follow up neuroma   65 y.o. male presents with the above complaint. History confirmed with patient. Did not feel much of a difference with the injection. Thinks the pad helps.   Objective:  Physical Exam: warm, good capillary refill, no trophic changes or ulcerative lesions, normal DP and PT pulses and normal sensory exam. Right Foot: tenderness between the 2nd and 3rd metatarsal head and Mulder's click 2nd interspac   Assessment:   1. Interdigital neuroma of right foot    Plan:  Patient was evaluated and treated and all questions answered.  Morton Neuroma -Repeat injection with more steroid today -If pain persists consider MRI and possible surgery -Continue padding. Additional pads dispensed today.  Procedure: Neuroma Injection Location: right 2nd interspace Skin Prep: Alcohol. Injectate: 0.5 cc 0.5% marcaine plain, 0.5 cc betamethasone acetate-betamethasone sodium phosphate Disposition: Patient tolerated procedure well. Injection site dressed with a band-aid.  Return in about 3 weeks (around 09/21/2020) for Neuroma.

## 2020-09-21 ENCOUNTER — Ambulatory Visit: Payer: BC Managed Care – PPO | Admitting: Podiatry

## 2020-09-25 ENCOUNTER — Ambulatory Visit: Payer: BC Managed Care – PPO | Admitting: Podiatry

## 2020-09-25 ENCOUNTER — Other Ambulatory Visit: Payer: Self-pay

## 2020-09-25 DIAGNOSIS — G5781 Other specified mononeuropathies of right lower limb: Secondary | ICD-10-CM | POA: Diagnosis not present

## 2020-09-25 MED ORDER — DEXAMETHASONE SODIUM PHOSPHATE 120 MG/30ML IJ SOLN
2.0000 mg | Freq: Once | INTRAMUSCULAR | Status: AC
  Administered 2020-09-25: 2 mg via INTRA_ARTICULAR

## 2020-09-25 MED ORDER — BETAMETHASONE SOD PHOS & ACET 6 (3-3) MG/ML IJ SUSP
6.0000 mg | Freq: Once | INTRAMUSCULAR | Status: AC
  Administered 2020-09-25: 6 mg

## 2020-09-25 NOTE — Progress Notes (Signed)
  Subjective:  Patient ID: Shawn Carter, male    DOB: 31-Oct-1955,  MRN: [id number]  Chief Complaint  Patient presents with  . Neuroma    Right interdigital follow up. Pt states some improvement. No new changes or sx.    65 y.o. male presents with the above complaint. History confirmed with patient. Has adjusted how he uses his pad. Not sure if it is getting better or if he is just getting more used to it.  Objective:  Physical Exam: warm, good capillary refill, no trophic changes or ulcerative lesions, normal DP and PT pulses and normal sensory exam. Right Foot: tenderness between the 2nd and 3rd metatarsal head and Mulder's click 2nd interspac   Assessment:   1. Interdigital neuroma of right foot    Plan:  Patient was evaluated and treated and all questions answered.  Morton Neuroma -Final injection today -Consider MRI if still symptomatic  Procedure: Neuroma Injection Location: Right 2nd interspace Skin Prep: Alcohol. Injectate: 0.5 cc 0.5% marcaine plain, 1 cc betamethasone acetate-betamethasone sodium phosphate, 0.5cc dexamethasone Disposition: Patient tolerated procedure well. Injection site dressed with a band-aid.   Return in about 4 weeks (around 10/23/2020) for Neuroma.

## 2020-10-23 ENCOUNTER — Ambulatory Visit: Payer: BC Managed Care – PPO | Admitting: Podiatry

## 2020-11-02 ENCOUNTER — Other Ambulatory Visit: Payer: Self-pay

## 2020-11-02 ENCOUNTER — Ambulatory Visit: Payer: BC Managed Care – PPO | Admitting: Podiatry

## 2020-11-02 DIAGNOSIS — G5781 Other specified mononeuropathies of right lower limb: Secondary | ICD-10-CM | POA: Diagnosis not present

## 2020-11-02 DIAGNOSIS — M7741 Metatarsalgia, right foot: Secondary | ICD-10-CM

## 2020-11-02 NOTE — Progress Notes (Signed)
  Subjective:  Patient ID: Shawn Carter, male    DOB: [birth year],  MRN: [id number]  Chief Complaint  Patient presents with   Neuroma    Pt states mild improvement but still experiencing discomfort.   65 y.o. male presents with the above complaint. History confirmed with patient. Still having pain especially after work. Has failed multiple injections for this issue and over 6 weeks of conservative care.  Objective:  Physical Exam: warm, good capillary refill, no trophic changes or ulcerative lesions, normal DP and PT pulses and normal sensory exam. Right Foot: tenderness between the 2nd and 3rd metatarsal head and Mulder's click 2nd interspac    Assessment:   1. Interdigital neuroma of right foot   2. Metatarsalgia, right foot    Plan:  Patient was evaluated and treated and all questions answered.  Morton Neuroma -No further injections. Order MRI to evaluate for possible excision of the nerve if indicated. -F/u in 3 weeks for MRI review.   No follow-ups on file.

## 2020-11-17 ENCOUNTER — Ambulatory Visit (HOSPITAL_COMMUNITY)
Admission: EM | Admit: 2020-11-17 | Discharge: 2020-11-17 | Disposition: A | Discharge status: Home / Self Care | Payer: BC Managed Care – PPO | Attending: Physician Assistant | Admitting: Physician Assistant

## 2020-11-17 ENCOUNTER — Encounter (HOSPITAL_COMMUNITY): Payer: Self-pay | Admitting: *Deleted

## 2020-11-17 ENCOUNTER — Other Ambulatory Visit: Payer: Self-pay

## 2020-11-17 DIAGNOSIS — L237 Allergic contact dermatitis due to plants, except food: Secondary | ICD-10-CM

## 2020-11-17 MED ORDER — METHYLPREDNISOLONE SODIUM SUCC 125 MG IJ SOLR
80.0000 mg | Freq: Once | INTRAMUSCULAR | Status: AC
  Administered 2020-11-17: 80 mg via INTRAMUSCULAR

## 2020-11-17 MED ORDER — PREDNISONE 10 MG (21) PO TBPK: ORAL_TABLET | Freq: Every day | ORAL | 0 refills | Status: AC

## 2020-11-17 MED ORDER — METHYLPREDNISOLONE SODIUM SUCC 125 MG IJ SOLR
INTRAMUSCULAR | Status: AC
  Filled 2020-11-17: qty 2

## 2020-11-17 MED ORDER — HYDROXYZINE HCL 25 MG PO TABS: 25.0000 mg | ORAL_TABLET | Freq: Four times a day (QID) | ORAL | 0 refills | Status: AC | PRN

## 2020-11-17 NOTE — Discharge Instructions (Addendum)
Injection of steroids here should help, but if not clearing in 2 days then start the pred pack. The atarax will  help with itching. IT can make you sleepy so caution with this. FU as needed.

## 2020-11-17 NOTE — ED Triage Notes (Signed)
Pt reports rash started last weekend after working in yard.

## 2020-11-17 NOTE — ED Provider Notes (Signed)
[facility]    CSN: [id number] Arrival date & time: 11/17/20  1437      History   Chief Complaint Chief Complaint  Patient presents with   Rash    HPI Shawn Carter is a 65 y.o. male.   Presents with a wide spread rash after working in the yard last weekend. He notes that he is allergic to poison  ivy. He is having severe itching despite topical creams and benadryl. No dyspnea.    Past Medical History:  Diagnosis Date   Diplopia    Hypertension    Myasthenia gravis (HCC)    Ptosis     Patient Active Problem List   Diagnosis Date Noted   Myasthenia gravis (HCC)    Diplopia    Ptosis     Past Surgical History:  Procedure Laterality Date   NO PAST SURGERIES         Home Medications    Prior to Admission medications   Medication Sig Start Date End Date Taking? Authorizing Provider  hydrOXYzine (ATARAX/VISTARIL) 25 MG tablet Take 1 tablet (25 mg total) by mouth every 6 (six) hours as needed. 11/17/20  Yes Nakiesha Rumsey, Dillard Cannon, PA-C  predniSONE (STERAPRED UNI-PAK 21 TAB) 10 MG (21) TBPK tablet Take by mouth daily. Take 6 tabs by mouth daily  for 2 days, then 5 tabs for 2 days, then 4 tabs for 2 days, then 3 tabs for 2 days, 2 tabs for 2 days, then 1 tab by mouth daily for 2 days 11/17/20  Yes Arrie Zuercher, Dillard Cannon, PA-C  aspirin EC 81 MG tablet Take 81 mg by mouth daily.    [provider]  lisinopril (PRINIVIL,ZESTRIL) 10 MG tablet Take 10 mg by mouth daily. 05/29/14   [provider]  pyridostigmine (MESTINON) 60 MG tablet Take 1 tablet (60 mg total) by mouth 2 (two) times daily as needed. 08/04/17   Levert Feinstein, MD  rosuvastatin (CRESTOR) 10 MG tablet 1 tablet    [provider]    Family History Family History  Problem Relation Age of Onset   Heart Problems Mother    Cancer Mother     Social History Social History   Tobacco Use   Smoking status: Every Day    Packs/day: 0.50    Pack years: 0.00    Types: Cigarettes    Smokeless tobacco: Never  Substance Use Topics   Alcohol use: No    Alcohol/week: 0.0 standard drinks   Drug use: No     Allergies   Patient has no known allergies.   Review of Systems Review of Systems  Skin:  Negative for rash.  All other systems reviewed and are negative.   Physical Exam Triage Vital Signs ED Triage Vitals  Enc Vitals Group     BP 11/17/20 1547 122/69     Pulse Rate 11/17/20 1547 (!) 103     Resp 11/17/20 1547 18     Temp 11/17/20 1547 98.3 F (36.8 C)     Temp src --      SpO2 11/17/20 1547 98 %     Weight --      Height --      Head Circumference --      Peak Flow --      Pain Score 11/17/20 1548 0     Pain Loc --      Pain Edu? --      Excl. in GC? --    No data found.  Updated Vital Signs BP 122/69   Pulse (!) 103   Temp 98.3 F (36.8 C)   Resp 18   SpO2 98%   Visual Acuity Right Eye Distance:   Left Eye Distance:   Bilateral Distance:    Right Eye Near:   Left Eye Near:    Bilateral Near:     Physical Exam Vitals and nursing note reviewed.  Constitutional:      General: He is not in acute distress.    Appearance: Normal appearance. He is not ill-appearing or diaphoretic.  HENT:     Head: Normocephalic.  Pulmonary:     Effort: Pulmonary effort is normal.  Skin:    General: Skin is warm and dry.     Findings: Rash present.     Comments: Macular papular rash on all exposed body surfaces with few excoriations  Neurological:     Mental Status: He is alert.  Psychiatric:        Mood and Affect: Mood normal.        Behavior: Behavior normal.     UC Treatments / Results  Labs (all labs ordered are listed, but only abnormal results are displayed) Labs Reviewed - No data to display  EKG   Radiology No results found.  Procedures Procedures (including critical care time)  Medications Ordered in UC Medications  methylPREDNISolone sodium succinate (SOLU-MEDROL) 125 mg/2 mL injection 80 mg (has no administration  in time range)    Initial Impression / Assessment and Plan / UC Course  I have reviewed the triage vital signs and the nursing notes.  Pertinent labs & imaging results that were available during my care of the patient were reviewed by me and considered in my medical decision making (see chart for details).    Moderate dermatitis . Treat with IM steroid followed by pred pack if needed. May take Atarax as needed for itching. FU if needed.  Final Clinical Impressions(s) / UC Diagnoses   Final diagnoses:  Poison ivy dermatitis     Discharge Instructions      Injection of steroids here should help, but if not clearing in 2 days then start the pred pack. The atarax will  help with itching. IT can make you sleepy so caution with this. FU as needed.    ED Prescriptions     Medication Sig Dispense Auth. Provider   predniSONE (STERAPRED UNI-PAK 21 TAB) 10 MG (21) TBPK tablet Take by mouth daily. Take 6 tabs by mouth daily  for 2 days, then 5 tabs for 2 days, then 4 tabs for 2 days, then 3 tabs for 2 days, 2 tabs for 2 days, then 1 tab by mouth daily for 2 days 42 tablet Kong Packett G, PA-C   hydrOXYzine (ATARAX/VISTARIL) 25 MG tablet Take 1 tablet (25 mg total) by mouth every 6 (six) hours as needed. 15 tablet Riki Sheer, [place]      PDMP not reviewed this encounter.   Riki Sheer, [place] 11/17/20 [phone]

## 2020-11-21 ENCOUNTER — Other Ambulatory Visit: Payer: Self-pay

## 2020-11-21 ENCOUNTER — Ambulatory Visit
Admission: RE | Admit: 2020-11-21 | Discharge: 2020-11-21 | Disposition: A | Discharge status: Home / Self Care | Payer: BC Managed Care – PPO | Source: Ambulatory Visit | Attending: Podiatry | Admitting: Podiatry

## 2020-11-21 ENCOUNTER — Other Ambulatory Visit: Payer: BLUE CROSS/BLUE SHIELD

## 2020-11-21 DIAGNOSIS — G5781 Other specified mononeuropathies of right lower limb: Secondary | ICD-10-CM

## 2020-11-21 DIAGNOSIS — R200 Anesthesia of skin: Secondary | ICD-10-CM | POA: Diagnosis not present

## 2020-11-21 DIAGNOSIS — M67471 Ganglion, right ankle and foot: Secondary | ICD-10-CM | POA: Diagnosis not present

## 2020-11-21 DIAGNOSIS — M7741 Metatarsalgia, right foot: Secondary | ICD-10-CM

## 2020-11-21 MED ORDER — GADOBENATE DIMEGLUMINE 529 MG/ML IV SOLN
17.0000 mL | Freq: Once | INTRAVENOUS | Status: AC | PRN
  Administered 2020-11-21: 17 mL via INTRAVENOUS

## 2020-11-23 ENCOUNTER — Ambulatory Visit: Payer: BC Managed Care – PPO | Admitting: Podiatry

## 2020-12-07 ENCOUNTER — Ambulatory Visit: Payer: BC Managed Care – PPO | Admitting: Podiatry

## 2020-12-07 ENCOUNTER — Other Ambulatory Visit: Payer: Self-pay

## 2020-12-07 DIAGNOSIS — M6740 Ganglion, unspecified site: Secondary | ICD-10-CM | POA: Diagnosis not present

## 2020-12-07 DIAGNOSIS — G5781 Other specified mononeuropathies of right lower limb: Secondary | ICD-10-CM

## 2020-12-07 DIAGNOSIS — M7741 Metatarsalgia, right foot: Secondary | ICD-10-CM | POA: Diagnosis not present

## 2020-12-07 NOTE — Progress Notes (Addendum)
  Subjective:  Patient ID: Shawn Carter, male    DOB: 04-28-1956,  MRN: [id number]  Chief Complaint  Patient presents with   Neuroma    MRI Review. Pt states his foot feels the same.   65 y.o. male presents with the above complaint. History confirmed with patient.  Here for MRI review.  Objective:  Physical Exam: warm, good capillary refill, no trophic changes or ulcerative lesions, normal DP and PT pulses and normal sensory exam. Right Foot: tenderness between the 2nd and 3rd metatarsal head and Mulder's click 2nd interspac   Study Result  Narrative & Impression  CLINICAL DATA:  Pain between the second and third toes with second toe numbness for the past 6 months. No injury or prior surgery.   EXAM: MRI OF THE RIGHT FOREFOOT WITHOUT AND WITH CONTRAST   TECHNIQUE: Multiplanar, multisequence MR imaging of the right forefoot was performed before and after the administration of intravenous contrast.   CONTRAST:  29mL MULTIHANCE GADOBENATE DIMEGLUMINE 529 MG/ML IV SOLN   COMPARISON:  None.   FINDINGS: Bones/Joint/Cartilage   No marrow signal abnormality. No fracture or dislocation. Joint spaces are preserved. No joint effusion.   Ligaments   Collateral ligaments are intact.  Lisfranc ligament is intact.   Muscles and Tendons Flexor and extensor tendons are intact. No muscle edema or atrophy.   Soft tissue There is a 6 x 4 x 4 mm ganglion cyst along the lateral aspect of the second MTP joint plantar plate (series 7, image 26). No discrete neuroma.   IMPRESSION: 1. 6 x 4 x 4 mm ganglion cyst along the lateral aspect of the second MTP joint plantar plate. 2. No discrete Morton neuroma.     Electronically Signed   By: Obie Dredge M.D.   On: 11/23/2020 21:51      Assessment:   1. Interdigital neuroma of right foot   2. Ganglion cyst   3. Metatarsalgia, right foot     Plan:  Patient was evaluated and treated and all questions answered.  Ganglion cyst,  neuroma -MRI reviewed -Discussed removal of the cyst with possible neuroma excision. Plan for plantar approach -Patient has failed all conservative therapy and wishes to proceed with surgical intervention. All risks, benefits, and alternatives discussed with patient. No guarantees given. Consent reviewed and signed by patient. -Planned procedures: excision of ganglion 2nd interspace, possible excision of 2nd interspace neuroma  -DME dispensed for post-op use: [facility]     No follow-ups on file.

## 2020-12-17 ENCOUNTER — Telehealth: Payer: Self-pay | Admitting: Podiatry

## 2020-12-17 NOTE — Telephone Encounter (Signed)
Yes that would be fine by me

## 2020-12-17 NOTE — Telephone Encounter (Signed)
Patient wasent sure how long he would be out of work, after procedure. He thought you said oow for 2 weeks, then he could return on light duty

## 2021-01-11 ENCOUNTER — Telehealth: Payer: Self-pay | Admitting: Urology

## 2021-01-11 NOTE — Telephone Encounter (Signed)
DOS - 01/23/21   EXC GANGLION/ TUMOR RIGHT --- 28090 NEURECTOMY RIGHT --- 28080   [employer] EFFECTIVE DATE - 05/12/20  PLAN DEDUCTIBLE - $3,000.00 W/ $97.00 REMAINING OUT OF POCKET - $6,550.00 W/ $3,647.00 REMAINING COINSURANCE - 0% COPAY - $0.00   SPOKE WITH ESSIE WITH [employer] AND SHE STATED THAT PRIOR AUTH IS REQUIRED, RECEIVED FAX ON 01/10/21 STATING THAT PRIOR AUTH IS NOT REQUIRED FOR CPT CODES 12458 AND 28080.  REF # [id number]

## 2021-01-23 ENCOUNTER — Encounter: Payer: Self-pay | Admitting: Podiatry

## 2021-01-23 ENCOUNTER — Other Ambulatory Visit: Payer: Self-pay | Admitting: Podiatry

## 2021-01-23 DIAGNOSIS — G5781 Other specified mononeuropathies of right lower limb: Secondary | ICD-10-CM | POA: Diagnosis not present

## 2021-01-23 DIAGNOSIS — D3613 Benign neoplasm of peripheral nerves and autonomic nervous system of lower limb, including hip: Secondary | ICD-10-CM | POA: Diagnosis not present

## 2021-01-23 DIAGNOSIS — M67471 Ganglion, right ankle and foot: Secondary | ICD-10-CM | POA: Diagnosis not present

## 2021-01-23 DIAGNOSIS — G588 Other specified mononeuropathies: Secondary | ICD-10-CM | POA: Diagnosis not present

## 2021-01-23 DIAGNOSIS — D1723 Benign lipomatous neoplasm of skin and subcutaneous tissue of right leg: Secondary | ICD-10-CM | POA: Diagnosis not present

## 2021-01-23 MED ORDER — CEPHALEXIN 500 MG PO CAPS: ORAL_CAPSULE | ORAL | 0 refills | Status: AC

## 2021-01-23 MED ORDER — OXYCODONE-ACETAMINOPHEN 5-325 MG PO TABS
1.0000 | ORAL_TABLET | ORAL | 0 refills | Status: AC | PRN
Start: 2021-01-23 — End: ?

## 2021-01-29 ENCOUNTER — Encounter: Payer: Self-pay | Admitting: Podiatry

## 2021-01-29 ENCOUNTER — Other Ambulatory Visit: Payer: Self-pay

## 2021-01-29 ENCOUNTER — Ambulatory Visit (INDEPENDENT_AMBULATORY_CARE_PROVIDER_SITE_OTHER): Payer: BC Managed Care – PPO | Admitting: Podiatry

## 2021-01-29 DIAGNOSIS — G5781 Other specified mononeuropathies of right lower limb: Secondary | ICD-10-CM

## 2021-01-29 DIAGNOSIS — M7741 Metatarsalgia, right foot: Secondary | ICD-10-CM

## 2021-01-29 DIAGNOSIS — M6740 Ganglion, unspecified site: Secondary | ICD-10-CM

## 2021-01-29 NOTE — Progress Notes (Signed)
  Subjective:  Patient ID: Shawn Carter, male    DOB: 29-Jan-1956,  MRN: [id number]  Chief Complaint  Patient presents with   Routine Post Op      POV #1 DOS 01/23/2021 EXCISION OF CYST RT 2ND INTERSPACE, POSS EXCISION OF NERVE 2ND INTERSPACE    DOS: 01/23/21 Procedure: Excision of right 2nd interspace cyst and neuroma   65 y.o. male presents with the above complaint. History confirmed with patient. Doing well, having some numbness in the 2nd/3rd toes feels like the foot is asleep. Pain controlled, not currently taking pain medicatio  Objective:  Physical Exam: tenderness at the surgical site, local edema noted, and calf supple, nontender. Incision: healing well, no significant drainage, no dehiscence, no significant erythema Assessment:   1. Interdigital neuroma of right foot   2. Ganglion cyst   3. Metatarsalgia, right foot     Plan:  Patient was evaluated and treated and all questions answered.  Post-operative State -Doing well as expected post-operatively. -Dressing applied consisting of sterile gauze, kerlix, and ACE bandage -NWB with crutches x 1 week, then can start WB as long as he is wearing the boot -Plan for suture/staple removal in 2 weeks -XRs needed at follow-up: none   No follow-ups on file.

## 2021-01-31 ENCOUNTER — Encounter: Payer: Self-pay | Admitting: Podiatry

## 2021-02-07 DIAGNOSIS — Z0000 Encounter for general adult medical examination without abnormal findings: Secondary | ICD-10-CM | POA: Diagnosis not present

## 2021-02-07 DIAGNOSIS — Z13220 Encounter for screening for lipoid disorders: Secondary | ICD-10-CM | POA: Diagnosis not present

## 2021-02-07 DIAGNOSIS — Z23 Encounter for immunization: Secondary | ICD-10-CM | POA: Diagnosis not present

## 2021-02-07 DIAGNOSIS — Z125 Encounter for screening for malignant neoplasm of prostate: Secondary | ICD-10-CM | POA: Diagnosis not present

## 2021-02-07 DIAGNOSIS — E785 Hyperlipidemia, unspecified: Secondary | ICD-10-CM | POA: Diagnosis not present

## 2021-02-12 ENCOUNTER — Ambulatory Visit: Payer: BC Managed Care – PPO

## 2021-02-12 ENCOUNTER — Ambulatory Visit (INDEPENDENT_AMBULATORY_CARE_PROVIDER_SITE_OTHER): Payer: BC Managed Care – PPO | Admitting: Podiatry

## 2021-02-12 ENCOUNTER — Other Ambulatory Visit: Payer: Self-pay

## 2021-02-12 DIAGNOSIS — Z9889 Other specified postprocedural states: Secondary | ICD-10-CM

## 2021-02-12 NOTE — Progress Notes (Signed)
  Subjective:  Patient ID: Shawn Carter, male    DOB: 04-13-1956,  MRN: [id number]  No chief complaint on file.   DOS: 01/23/21 Procedure: Excision of right 2nd interspace cyst and neuroma   65 y.o. male presents with the above complaint. History confirmed with patient. Doing well has been NWB with crutches. Denies new interval issues.  Objective:  Physical Exam: tenderness at the surgical site, local edema noted, and calf supple, nontender. Incision: healing well, no significant drainage, no dehiscence, no significant erythema Assessment:   1. Post-operative state     Plan:  Patient was evaluated and treated and all questions answered.  Post-operative State -Doing well as expected post-operatively. -Dressing applied consisting of sterile gauze, kerlix, and ACE bandage  -WBAT as long as he is wearing the boot -Sutures and staples left intact for 1 additional week -XRs needed at follow-up: none   No follow-ups on file.

## 2021-02-18 ENCOUNTER — Ambulatory Visit (INDEPENDENT_AMBULATORY_CARE_PROVIDER_SITE_OTHER): Payer: BC Managed Care – PPO

## 2021-02-18 ENCOUNTER — Other Ambulatory Visit: Payer: Self-pay

## 2021-02-18 DIAGNOSIS — Z9889 Other specified postprocedural states: Secondary | ICD-10-CM

## 2021-02-18 NOTE — Progress Notes (Signed)
Patient in office to have sutures and staples removed from the right foot. Both staples and sutures were removed without complications. Patient denies nausea, vomiting, fever and chills at this time. Triple antibiotic ointment was applied to the incision area and covered with non adhesive dressing. Covered with an ACE wrap and stockingnet. Advised patient to continue monitoring for signs and symptoms of infection and can start WB with boot on at all times. Patient verbalized understanding. Follow-up with Dr. Samuella Cota in 2-3 weeks.

## 2021-03-05 ENCOUNTER — Ambulatory Visit: Payer: BC Managed Care – PPO | Admitting: Podiatry

## 2021-03-05 ENCOUNTER — Other Ambulatory Visit: Payer: Self-pay

## 2021-03-05 DIAGNOSIS — G5781 Other specified mononeuropathies of right lower limb: Secondary | ICD-10-CM | POA: Diagnosis not present

## 2021-03-05 DIAGNOSIS — Z9889 Other specified postprocedural states: Secondary | ICD-10-CM | POA: Diagnosis not present

## 2021-03-05 DIAGNOSIS — M6740 Ganglion, unspecified site: Secondary | ICD-10-CM | POA: Diagnosis not present

## 2021-03-05 NOTE — Progress Notes (Signed)
  Subjective:  Patient ID: Shawn Carter, male    DOB: 12/16/1955,  MRN: [id number]  No chief complaint on file.  DOS: 01/23/21 Procedure: Excision of right 2nd interspace cyst and neuroma   65 y.o. male presents with the above complaint. History confirmed with patient. States he is not having any pain, just having some tingling and nerve symptoms especially when water runs over it in the shower.  Objective:  Physical Exam: no tenderness at the surgical site, no edema noted, and calf supple, nontender. Incision: healing well small fissure without depth no Warmth, erythema, SOI. Assessment:   1. Post-operative state   2. Interdigital neuroma of right foot   3. Ganglion cyst     Plan:  Patient was evaluated and treated and all questions answered.  Post-operative State -Doing well as expected post-operatively. -Forefoot offloading shoe dispensed -Abx ointment applied to incision -Transition to normal shoe gear as tolerated. -XRs needed at follow-up: none   Return in about 3 weeks (around 03/26/2021) for Post-Op (No XRs).

## 2021-03-07 ENCOUNTER — Encounter: Payer: Self-pay | Admitting: Podiatry

## 2021-03-26 ENCOUNTER — Encounter: Payer: Self-pay | Admitting: Podiatry

## 2021-03-26 ENCOUNTER — Other Ambulatory Visit: Payer: Self-pay

## 2021-03-26 ENCOUNTER — Ambulatory Visit: Payer: BC Managed Care – PPO | Admitting: Podiatry

## 2021-03-26 DIAGNOSIS — M6740 Ganglion, unspecified site: Secondary | ICD-10-CM

## 2021-03-26 DIAGNOSIS — Z9889 Other specified postprocedural states: Secondary | ICD-10-CM

## 2021-03-26 DIAGNOSIS — G5781 Other specified mononeuropathies of right lower limb: Secondary | ICD-10-CM

## 2021-03-26 NOTE — Progress Notes (Signed)
  Subjective:  Patient ID: Shawn Carter, male    DOB: [birth year],  MRN: [id number]  No chief complaint on file.  DOS: 01/23/21 Procedure: Excision of right 2nd interspace cyst and neuroma   65 y.o. male presents with the above complaint. History confirmed with patient. Denies pain, does report some difficulty walking in the forefoot shoe. Has a little difficulty with first steps of the morning but improves throughout the day. Denies new issues.  Objective:  Physical Exam: no tenderness at the surgical site, no edema noted, and calf supple, nontender. Incision: wound well healed. Assessment:   1. Post-operative state   2. Interdigital neuroma of right foot   3. Ganglion cyst    Plan:  Patient was evaluated and treated and all questions answered.  Post-operative State -Doing well as expected post-operatively. -Transition to normal shoe gear as tolerated. Will allow 2 weeks to return to normal shoegear - plan return to work 11/28.  -XRs needed at follow-up: none   Return in about 4 weeks (around 04/23/2021) for Post-Op (No XRs).

## 2021-04-23 ENCOUNTER — Encounter: Payer: Self-pay | Admitting: Podiatry

## 2021-04-23 ENCOUNTER — Other Ambulatory Visit: Payer: Self-pay

## 2021-04-23 ENCOUNTER — Ambulatory Visit (INDEPENDENT_AMBULATORY_CARE_PROVIDER_SITE_OTHER): Payer: BC Managed Care – PPO | Admitting: Podiatry

## 2021-04-23 DIAGNOSIS — G5781 Other specified mononeuropathies of right lower limb: Secondary | ICD-10-CM | POA: Diagnosis not present

## 2021-04-23 DIAGNOSIS — M6740 Ganglion, unspecified site: Secondary | ICD-10-CM

## 2021-04-23 DIAGNOSIS — M216X9 Other acquired deformities of unspecified foot: Secondary | ICD-10-CM

## 2021-04-23 NOTE — Progress Notes (Signed)
°  Subjective:  Patient ID: Shawn Carter, male    DOB: 04-13-1956,  MRN: [id number]  Chief Complaint  Patient presents with   Routine Post Op    POV #6 DOS 01/23/21 Excision of Right 2nd interspace cyst and neuroma    DOS: 01/23/21 Procedure: Excision of right 2nd interspace cyst and neuroma   65 y.o. male presents with the above complaint. History confirmed with patient. He is having pain first thing in the morning in the ball of the foot. He is doing ok in sneakers as he walks around. He is not back at work - states work is concerned for the amount of activity he will need to do to return full time.  Objective:  Physical Exam: no tenderness at the surgical site, no edema noted, and calf supple, nontender. Decreased ankle joint ROM. Incision: wound well healed. Thin scarring. Assessment:   1. Interdigital neuroma of right foot   2. Ganglion cyst   3. Equinus deformity of foot    Plan:  Patient was evaluated and treated and all questions answered.  Post-operative State -Doing well as expected post-operatively. -Continue normal shoegear -Dispense night splint to stretch ankle and prevent forefoot pressure -Dispense metatarsal pads for during the day. -XRs needed at follow-up: none  Return in about 6 weeks (around 06/04/2021) for neuroma f/u.

## 2021-06-04 ENCOUNTER — Ambulatory Visit (INDEPENDENT_AMBULATORY_CARE_PROVIDER_SITE_OTHER): Payer: BC Managed Care – PPO | Admitting: Podiatry

## 2021-06-04 ENCOUNTER — Other Ambulatory Visit: Payer: Self-pay

## 2021-06-04 DIAGNOSIS — Z860100 Personal history of colon polyps, unspecified: Secondary | ICD-10-CM | POA: Insufficient documentation

## 2021-06-04 DIAGNOSIS — G5781 Other specified mononeuropathies of right lower limb: Secondary | ICD-10-CM | POA: Diagnosis not present

## 2021-06-04 DIAGNOSIS — I10 Essential (primary) hypertension: Secondary | ICD-10-CM | POA: Insufficient documentation

## 2021-06-04 DIAGNOSIS — N400 Enlarged prostate without lower urinary tract symptoms: Secondary | ICD-10-CM | POA: Insufficient documentation

## 2021-06-04 DIAGNOSIS — M6740 Ganglion, unspecified site: Secondary | ICD-10-CM

## 2021-06-04 DIAGNOSIS — E785 Hyperlipidemia, unspecified: Secondary | ICD-10-CM | POA: Insufficient documentation

## 2021-06-04 DIAGNOSIS — M216X9 Other acquired deformities of unspecified foot: Secondary | ICD-10-CM

## 2021-06-04 DIAGNOSIS — Z720 Tobacco use: Secondary | ICD-10-CM | POA: Insufficient documentation

## 2021-06-04 NOTE — Progress Notes (Signed)
°  Subjective:  Patient ID: Shawn Carter, male    DOB: Nov 18, 1955,  MRN: [id number]  Chief Complaint  Patient presents with   Routine Post Op     POV #7 DOS 01/23/21 Excision of Right 2nd interspace cyst and neuroma   DOS: 01/23/21 Procedure: Excision of right 2nd interspace cyst and neuroma   66 y.o. male presents with the above complaint. History confirmed with patient.  No longer having significant pain in the ball of foot doing rather well overall.  Using the pads and thinks that they help a lot.  Reports pain only to a 10 and not consistent.  Objective:  Physical Exam: no tenderness at the surgical site, no edema noted, and calf supple, nontender. Decreased ankle joint ROM. Incision: wound well healed. Thin scarring. Assessment:   1. Interdigital neuroma of right foot   2. Ganglion cyst   3. Equinus deformity of foot     Plan:  Patient was evaluated and treated and all questions answered.  Post-operative State -He is doing very well in the area does not seem to have any callus or inflammation well-healed thin scar.  I think he might benefit from some continued metatarsal padding until fully matured to the area. Gave information on orthotics including codes so he can check with his insurance company to see if he wants to proceed with ordering them.  Should he want them he would benefit from metatarsal pads and the orthotics -At this time will discharge with follow-up only as needed  No follow-ups on file.

## 2021-07-13 DIAGNOSIS — J069 Acute upper respiratory infection, unspecified: Secondary | ICD-10-CM | POA: Diagnosis not present

## 2021-07-27 DIAGNOSIS — J208 Acute bronchitis due to other specified organisms: Secondary | ICD-10-CM | POA: Diagnosis not present

## 2021-08-22 DIAGNOSIS — G7000 Myasthenia gravis without (acute) exacerbation: Secondary | ICD-10-CM | POA: Diagnosis not present

## 2021-08-22 DIAGNOSIS — I10 Essential (primary) hypertension: Secondary | ICD-10-CM | POA: Diagnosis not present

## 2022-02-22 DIAGNOSIS — Z23 Encounter for immunization: Secondary | ICD-10-CM | POA: Diagnosis not present

## 2022-02-24 DIAGNOSIS — Z0000 Encounter for general adult medical examination without abnormal findings: Secondary | ICD-10-CM | POA: Diagnosis not present

## 2022-02-24 DIAGNOSIS — I10 Essential (primary) hypertension: Secondary | ICD-10-CM | POA: Diagnosis not present

## 2022-02-24 DIAGNOSIS — E785 Hyperlipidemia, unspecified: Secondary | ICD-10-CM | POA: Diagnosis not present

## 2022-02-24 DIAGNOSIS — Z125 Encounter for screening for malignant neoplasm of prostate: Secondary | ICD-10-CM | POA: Diagnosis not present

## 2022-02-24 DIAGNOSIS — N400 Enlarged prostate without lower urinary tract symptoms: Secondary | ICD-10-CM | POA: Diagnosis not present

## 2022-02-24 DIAGNOSIS — Z23 Encounter for immunization: Secondary | ICD-10-CM | POA: Diagnosis not present

## 2023-03-09 DIAGNOSIS — G7000 Myasthenia gravis without (acute) exacerbation: Secondary | ICD-10-CM | POA: Diagnosis not present

## 2023-03-09 DIAGNOSIS — Z23 Encounter for immunization: Secondary | ICD-10-CM | POA: Diagnosis not present

## 2023-03-09 DIAGNOSIS — Z0000 Encounter for general adult medical examination without abnormal findings: Secondary | ICD-10-CM | POA: Diagnosis not present

## 2023-03-09 DIAGNOSIS — I10 Essential (primary) hypertension: Secondary | ICD-10-CM | POA: Diagnosis not present

## 2023-03-09 DIAGNOSIS — N400 Enlarged prostate without lower urinary tract symptoms: Secondary | ICD-10-CM | POA: Diagnosis not present

## 2023-03-09 DIAGNOSIS — Z125 Encounter for screening for malignant neoplasm of prostate: Secondary | ICD-10-CM | POA: Diagnosis not present

## 2023-03-09 DIAGNOSIS — E785 Hyperlipidemia, unspecified: Secondary | ICD-10-CM | POA: Diagnosis not present

## 2023-03-23 IMAGING — MR MR FOOT*R* WO/W CM
9 series · 40 of 40 positions shown · IV contrast (multihance)
Comparison: None.

CLINICAL DATA: Pain between the second and third toes with second
toe numbness for the past 6 months. No injury or prior surgery.

EXAM:
MRI OF THE RIGHT FOREFOOT WITHOUT AND WITH CONTRAST
TECHNIQUE: Multiplanar, multisequence MR imaging of the right forefoot was
performed before and after the administration of intravenous
contrast.
CONTRAST:  17mL MULTIHANCE GADOBENATE DIMEGLUMINE 529 MG/ML IV SOLN

[Series 6: T1 · coronal · 3.0mm · 0.38mm/px · 1 of 12 slices shown (1 of 3)]
[im 1/12]
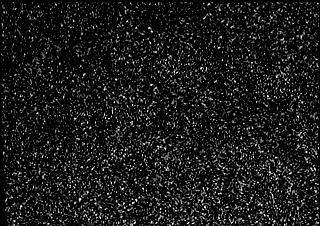

[Series 7: T2 fat-sat · coronal · 3.0mm · 0.38mm/px · 7 of 46 slices shown (1 of 2)]
[im 1/46]
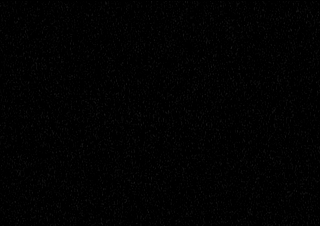
[im 8/46]
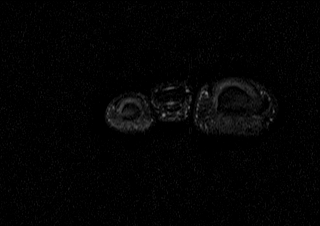
[im 16/46]
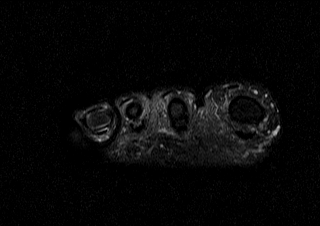
[im 23/46]
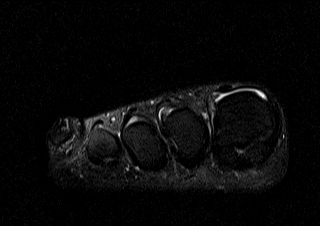
[im 31/46]
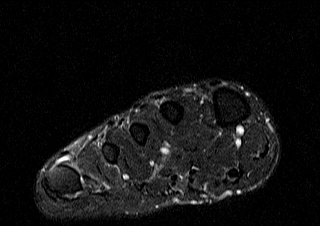
[im 38/46]
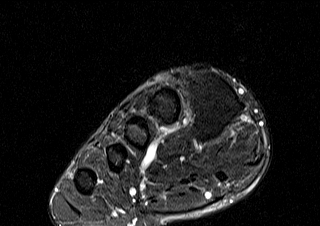
[im 46/46]
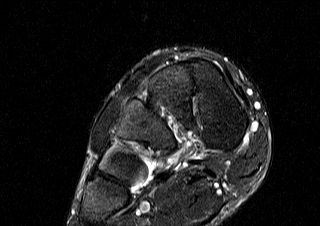

[Series 8: T1 · coronal · 3.0mm · 0.38mm/px · 7 of 46 slices shown (2 of 3)]
[im 1/46]
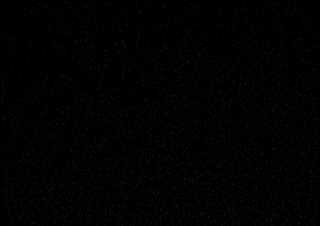
[im 8/46]
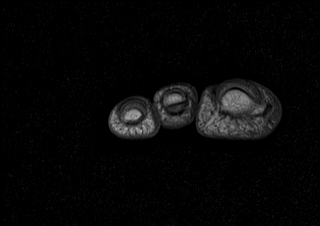
[im 16/46]
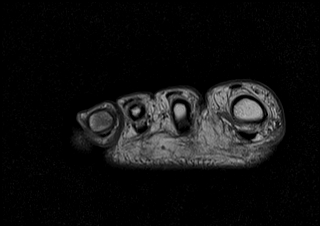
[im 23/46]
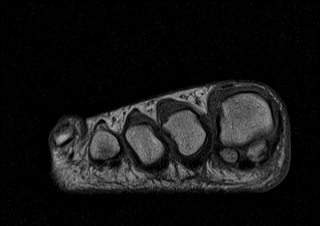
[im 31/46]
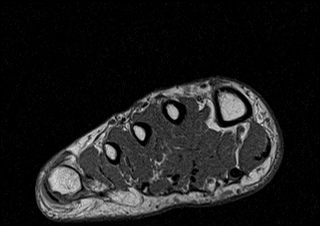
[im 38/46]
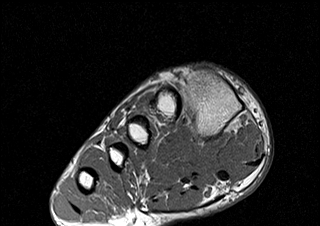
[im 46/46]
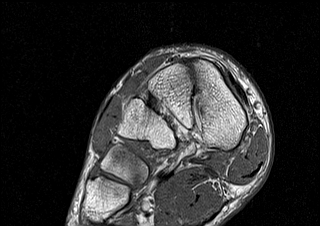

[Series 9: T2 fat-sat · axial · 3.0mm · 0.62mm/px · z∈[-129,-74]mm · 2 of 16 slices shown (2 of 2)]
[im 1/16]
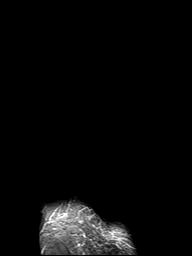
[im 16/16]
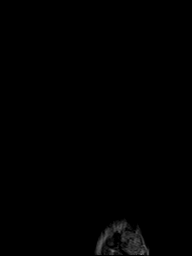

[Series 10: T1 · axial · 3.0mm · 0.42mm/px · z∈[-128,-74]mm · 2 of 16 slices shown (3 of 3)]
[im 1/16]
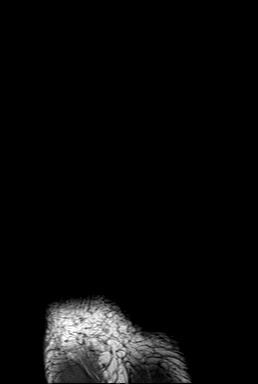
[im 16/16]
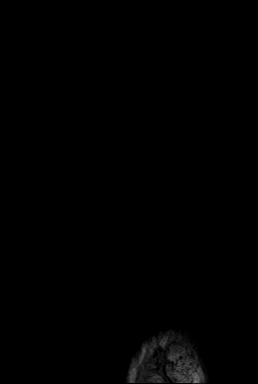

[Series 11: PD fat-sat · sagittal · 3.0mm · 0.53mm/px · 5 of 34 slices shown]
[im 1/34]
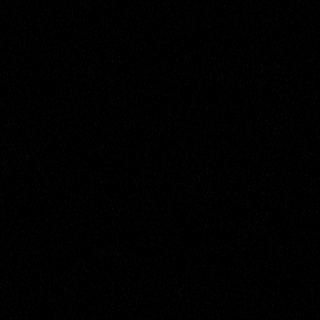
[im 9/34]
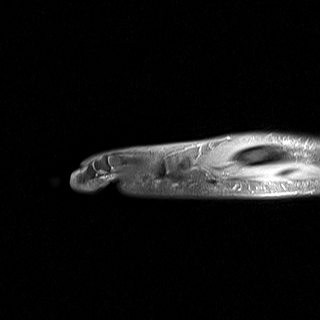
[im 17/34]
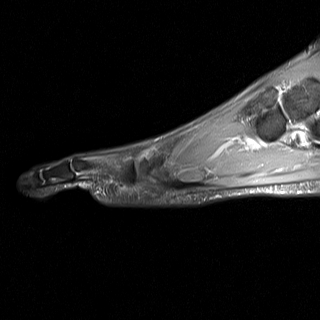
[im 25/34]
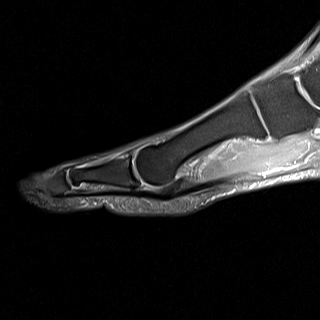
[im 34/34]
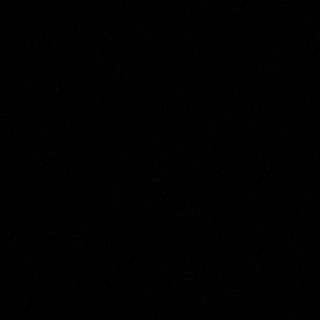

[Series 12: T1 fat-sat · coronal · non-contrast · 3.0mm · 0.44mm/px · 7 of 46 slices shown]
[im 1/46]
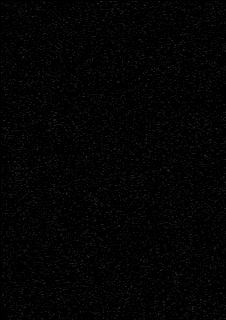
[im 8/46]
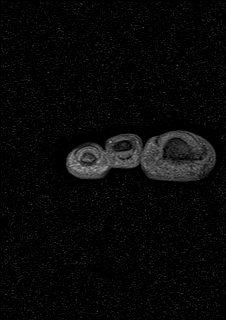
[im 16/46]
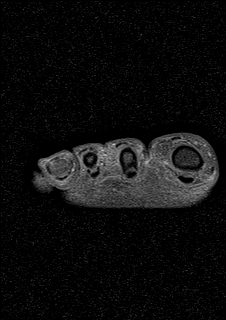
[im 23/46]
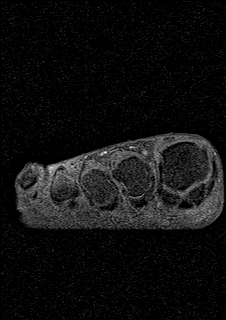
[im 31/46]
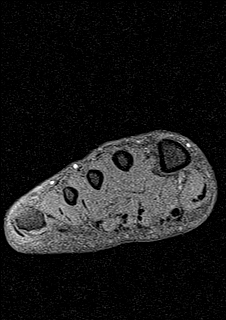
[im 38/46]
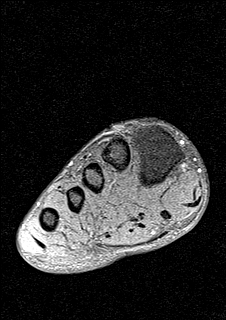
[im 46/46]
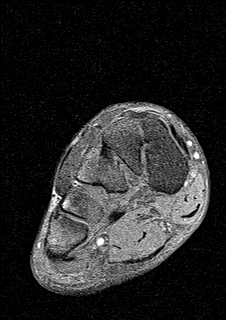

[Series 13: T1 fat-sat post-contrast · coronal · 3.0mm · 0.44mm/px · 7 of 46 slices shown (1 of 2)]
[im 1/46]
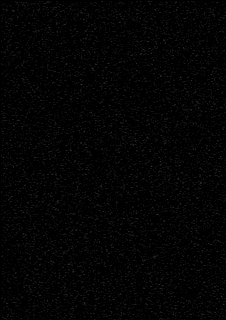
[im 8/46]
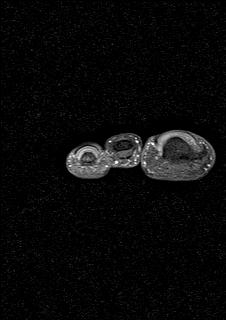
[im 16/46]
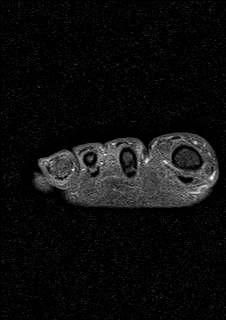
[im 23/46]
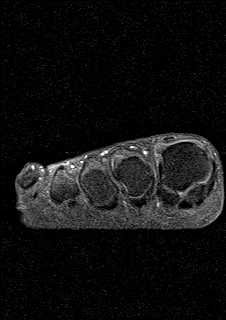
[im 31/46]
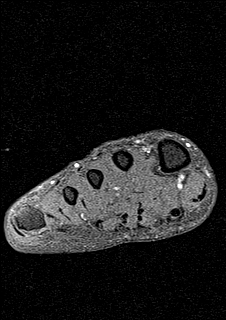
[im 38/46]
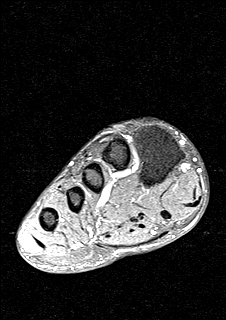
[im 46/46]
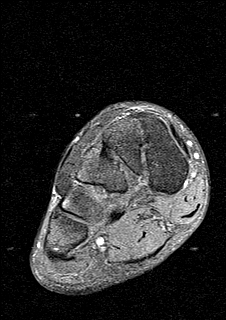

[Series 14: T1 fat-sat post-contrast · axial · 3.0mm · 0.50mm/px · z∈[-128,-74]mm · 2 of 16 slices shown (2 of 2)]
[im 1/16]
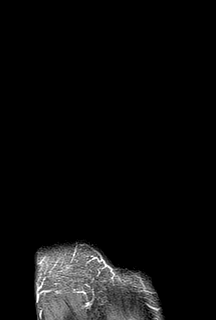
[im 16/16]
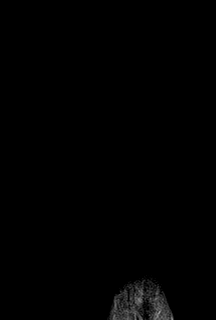

[40 of 40 positions shown; findings below may reference images not displayed]

FINDINGS: Bones/Joint/Cartilage

No marrow signal abnormality. No fracture or dislocation. Joint
spaces are preserved. No joint effusion.

Ligaments

Collateral ligaments are intact.  Lisfranc ligament is intact.

Muscles and Tendons
Flexor and extensor tendons are intact. No muscle edema or atrophy.

Soft tissue
There is a 6 x 4 x 4 mm ganglion cyst along the lateral aspect of
the second MTP joint plantar plate (series 7, image 26). No discrete
neuroma.
IMPRESSION: 1. 6 x 4 x 4 mm ganglion cyst along the lateral aspect of the second
MTP joint plantar plate.
2. No discrete Morton neuroma.
